# Patient Record
Sex: Female | Born: 1960 | Race: White | Hispanic: No | Marital: Married | Smoking: Never smoker
Health system: Southern US, Community
[De-identification: ages and names within clinical notes are randomized; demographics above are authoritative.]

## PROBLEM LIST (undated history)

## (undated) HISTORY — PX: CHOLECYSTECTOMY: SHX55

---

## 2018-02-05 ENCOUNTER — Encounter (HOSPITAL_COMMUNITY): Payer: Self-pay | Admitting: Emergency Medicine

## 2018-02-05 ENCOUNTER — Emergency Department (HOSPITAL_COMMUNITY)
Admission: EM | Admit: 2018-02-05 | Discharge: 2018-02-05 | Disposition: A | Discharge status: Home / Self Care | Payer: BLUE CROSS/BLUE SHIELD | Attending: Emergency Medicine | Admitting: Emergency Medicine

## 2018-02-05 DIAGNOSIS — M549 Dorsalgia, unspecified: Secondary | ICD-10-CM | POA: Diagnosis present

## 2018-02-05 DIAGNOSIS — M546 Pain in thoracic spine: Secondary | ICD-10-CM | POA: Diagnosis not present

## 2018-02-05 MED ORDER — TRAMADOL HCL 50 MG PO TABS: 50.0000 mg | ORAL_TABLET | Freq: Four times a day (QID) | ORAL | 0 refills | Status: AC | PRN

## 2018-02-05 MED ORDER — LIDOCAINE 5 % EX PTCH: 1.0000 | MEDICATED_PATCH | CUTANEOUS | 0 refills | Status: AC

## 2018-02-05 MED ORDER — HYDROMORPHONE HCL 1 MG/ML IJ SOLN
0.5000 mg | Freq: Once | INTRAMUSCULAR | Status: AC
  Administered 2018-02-05: 0.5 mg via INTRAMUSCULAR
  Filled 2018-02-05: qty 1

## 2018-02-05 NOTE — ED Triage Notes (Signed)
Pt reports left upper back pain since last Saturday. Reports is worse with movement, seen by 3 other doctors without any relief of pain so came to ED for help.

## 2018-02-05 NOTE — ED Notes (Signed)
Pt decline d/c vitals. I offered to recheck them and she states, "let's skip it."

## 2018-02-05 NOTE — Discharge Instructions (Signed)
Continue to take your steroid medication as prescribed.  Do not take ibuprofen, Advil, Aleve, or Motrin with this medication.  You may take [phone] mg of Tylenol every 6 hours as needed for pain additionally.  You may take tramadol as needed for severe pain but do not drive, drink alcohol, or operate heavy machinery while taking this medication as it may make you drowsy.  Apply an ice pack or heating pad to the area for comfort.  Take hot showers or hot baths and do some gentle stretching exercises to avoid muscle stiffness.  Apply the lidocaine patch to the area of pain along the back.  Follow-up with an orthopedist for reevaluation of your symptoms.  I encourage you to continue going to physical therapy 2-3 times weekly at least.  Return to the emergency department if any concerning signs or symptoms develop such as weakness, high fevers

## 2018-02-05 NOTE — ED Provider Notes (Signed)
[facility]-EMERGENCY DEPT Provider Note   CSN: [id number] Arrival date & time: 02/05/18  1512     History   Chief Complaint Chief Complaint  Patient presents with  . Back Pain    HPI Bridget Molina is a 57 y.o. female with no significant past medical history presents today for evaluation of acute onset, constant left upper thoracic back pain for 6 days.  She states that she awoke on Saturday morning with sharp pain just lateral to the spine on the left side.  She states that pain improves laying flat on her back but worsens with any movement or ambulation.  Does not necessarily worsen with range of motion of the left shoulder.  She does note pain radiating down the left upper extremity and intermittent numbness and tingling.  She denies fevers, chills, low back pain, IV drug use, or history of cancer.  She was seen and evaluated at urgent care and was given 20 hydrocodone/acetaminophen 5-325 mg tablets without relief of her symptoms.  She was also given a steroid injection into her hip without relief of her symptoms.  She denies any hip pain so she is unsure why she received this injection into the hip.  She was seen and evaluated by an orthopedic physician Dr. Farris Has 2 days ago and underwent x-rays.  She was told that she had arthritis in her back which could be contributing to her symptoms.  She states he discharged her with a steroid taper and methocarbamol.  She states neither of these medications have been helpful.  She has been taking Tylenol as well without relief of her symptoms.  She is scheduled for an MRI on Thursday but states "I cannot wait that long".  She denies any recent trauma or falls.  The history is provided by the patient.    History reviewed. No pertinent past medical history.  There are no active problems to display for this patient.   Past Surgical History:  Procedure Laterality Date  . CESAREAN SECTION    . CHOLECYSTECTOMY       OB History    None      Home Medications    Prior to Admission medications   Medication Sig Start Date End Date Taking? Authorizing Provider  lidocaine (LIDODERM) 5 % Place 1 patch onto the skin daily. Remove & Discard patch within 12 hours 02/05/18   Michela Pitcher A, PA-C  traMADol (ULTRAM) 50 MG tablet Take 1 tablet (50 mg total) by mouth every 6 (six) hours as needed for severe pain. 02/05/18   Jeanie Sewer, PA-C    Family History No family history on file.  Social History Social History   Tobacco Use  . Smoking status: Never Smoker  . Smokeless tobacco: Never Used  Substance Use Topics  . Alcohol use: Not on file  . Drug use: Not on file     Allergies   Patient has no allergy information on record.   Review of Systems Review of Systems   Physical Exam Updated Vital Signs BP 125/74 (BP Location: Left Arm)   Pulse 83   Temp 98.2 F (36.8 C) (Oral)   Resp 19   Ht 5' (1.524 m)   Wt 72.1 kg (159 lb)   SpO2 100%   BMI 31.05 kg/m   Physical Exam  Constitutional: She is oriented to person, place, and time. She appears well-developed and well-nourished. No distress.  Resting in chair, moving around frequently and appears somewhat uncomfortable.  Somewhat  improved when laying flat  HENT:  Head: Normocephalic and atraumatic.  Eyes: Conjunctivae are normal. Right eye exhibits no discharge. Left eye exhibits no discharge.  Neck: Normal range of motion and full passive range of motion without pain. Neck supple. No JVD present. No tracheal deviation present.  No midline spine TTP, no paraspinal muscle tenderness, no deformity, crepitus, or step-off noted.   Cardiovascular: Normal rate, regular rhythm, normal heart sounds and intact distal pulses.  2+ radial pulses bilaterally  Pulmonary/Chest: Effort normal and breath sounds normal.  Abdominal: She exhibits no distension.  Musculoskeletal: Normal range of motion. She exhibits tenderness. She exhibits no edema.  No midline spine  TTP, no deformity, crepitus, or step-off noted.  She has left-sided parathoracic muscle tenderness at around the level of T4/T5 and muscle spasm in that region.  5/5 strength of BUE and BLE major muscle groups.  Pain is elicited in the back with internal rotation of the left shoulder.  She has mild tenderness to palpation of the left acromioclavicular joint.  No ecchymosis, swelling, or crepitus.  Positive supraspinatus test, negative Hawkins or Neer's impingement sign.   Neurological: She is alert and oriented to person, place, and time. No cranial nerve deficit or sensory deficit. She exhibits normal muscle tone.  Fluent speech with no evidence of dysarthria or aphasia, no facial droop, sensation intact to soft touch of bilateral upper extremities.  Skin: Skin is warm and dry. No erythema.  Psychiatric: She has a normal mood and affect. Her behavior is normal.  Nursing note and vitals reviewed.    ED Treatments / Results  Labs (all labs ordered are listed, but only abnormal results are displayed) Labs Reviewed - No data to display  EKG None  Radiology No results found.  Procedures Procedures (including critical care time)  Medications Ordered in ED Medications  HYDROmorphone (DILAUDID) injection 0.5 mg (0.5 mg Intramuscular Given 02/05/18 1741)     Initial Impression / Assessment and Plan / ED Course  I have reviewed the triage vital signs and the nursing notes.  Pertinent labs & imaging results that were available during my care of the patient were reviewed by me and considered in my medical decision making (see chart for details).     Patient presents with acute left-sided thoracic back pain with radiation into the left upper extremity.  She is afebrile, vital signs are stable.  She is nontoxic in appearance.  She is neurovascularly intact and no focal neurologic deficits on examination.  Pain is relieved when laying flat on her back but worsens with standing and movement.   She has been seen by an urgent care physician and an orthopedist and has been offered narcotic pain medicine, muscle relaxant, and p.o. steroids without relief of her symptoms.  She is in the process of obtaining an MRI later next week for further evaluation.  I doubt acute cardiopulmonary abnormality.  Symptoms do appear to be musculoskeletal in etiology, possible thoracic radiculopathy.  No instability to the spine noted on examination.  She has had imaging with other physicians which suggested possible osteoarthritis.  I doubt CVA, MI, EBV.  I encouraged her to continue taking her steroid pack as she has not completed it.  I offered her tramadol and lidocaine patches which she accepted.  I encouraged her to follow-up with physical therapy at least 2-3 times weekly and follow-up with her orthopedist for further evaluation and possible steroid injections.  I also discussed the utility of ice, heat, and gentle  stretching.She tells me she was not impressed by the bedside manner of the orthopedic physician who treated her and I encouraged her to find another orthopedic physician to manage her symptoms in the long run.  I discussed strict ED return precautions.  Patient and patient's husband verbalized understanding of and agreement with plan and patient is stable for discharge home at this time.  Final Clinical Impressions(s) / ED Diagnoses   Final diagnoses:  Acute left-sided thoracic back pain    ED Discharge Orders        Ordered    lidocaine (LIDODERM) 5 %  Every 24 hours     02/05/18 1727    traMADol (ULTRAM) 50 MG tablet  Every 6 hours PRN     02/05/18 1727       Jeanie SewerFawze, Chanon Loney A, PA-C 02/05/18 2153    Arby BarrettePfeiffer, Marcy, MD 02/08/18 [phone]

## 2018-02-15 ENCOUNTER — Other Ambulatory Visit: Payer: Self-pay | Admitting: Sports Medicine

## 2018-02-15 DIAGNOSIS — M5412 Radiculopathy, cervical region: Secondary | ICD-10-CM

## 2018-02-19 ENCOUNTER — Ambulatory Visit
Admission: RE | Admit: 2018-02-19 | Discharge: 2018-02-19 | Disposition: A | Discharge status: Home / Self Care | Payer: BLUE CROSS/BLUE SHIELD | Source: Ambulatory Visit | Attending: Sports Medicine | Admitting: Sports Medicine

## 2018-02-19 DIAGNOSIS — M5412 Radiculopathy, cervical region: Secondary | ICD-10-CM

## 2018-02-19 MED ORDER — TRIAMCINOLONE ACETONIDE 40 MG/ML IJ SUSP (RADIOLOGY)
60.0000 mg | Freq: Once | INTRAMUSCULAR | Status: AC
  Administered 2018-02-19: 60 mg via EPIDURAL

## 2018-02-19 MED ORDER — IOPAMIDOL (ISOVUE-M 300) INJECTION 61%
1.0000 mL | Freq: Once | INTRAMUSCULAR | Status: AC | PRN
  Administered 2018-02-19: 1 mL via EPIDURAL

## 2018-02-19 NOTE — Discharge Instructions (Signed)

## 2018-02-25 ENCOUNTER — Other Ambulatory Visit: Payer: BLUE CROSS/BLUE SHIELD

## 2018-02-25 ENCOUNTER — Other Ambulatory Visit: Payer: Self-pay | Admitting: Sports Medicine

## 2018-02-25 DIAGNOSIS — M5412 Radiculopathy, cervical region: Secondary | ICD-10-CM

## 2018-03-08 ENCOUNTER — Ambulatory Visit
Admission: RE | Admit: 2018-03-08 | Discharge: 2018-03-08 | Disposition: A | Discharge status: Home / Self Care | Payer: BLUE CROSS/BLUE SHIELD | Source: Ambulatory Visit | Attending: Sports Medicine | Admitting: Sports Medicine

## 2018-03-08 DIAGNOSIS — M5412 Radiculopathy, cervical region: Secondary | ICD-10-CM

## 2018-03-08 MED ORDER — TRIAMCINOLONE ACETONIDE 40 MG/ML IJ SUSP (RADIOLOGY)
60.0000 mg | Freq: Once | INTRAMUSCULAR | Status: AC
  Administered 2018-03-08: 60 mg via EPIDURAL

## 2018-03-08 MED ORDER — IOPAMIDOL (ISOVUE-M 300) INJECTION 61%
1.0000 mL | Freq: Once | INTRAMUSCULAR | Status: AC | PRN
  Administered 2018-03-08: 1 mL via EPIDURAL

## 2018-10-17 IMAGING — XA DG INJECT/[PERSON_NAME] INC NEEDLE/CATH/PLC EPI/CERV/THOR W/IMG
2 series · 2 of 2 positions shown · non-contrast
Comparison: none

CLINICAL DATA: Cervical radiculopathy. Left scapular pain and upper
extremity radiculopathy. The patient reports approximately 50%
improvement since the prior injection. The relief was better at
first, but is gradually getting worse.

[Series 1: ortho standard · 1 of 1 slices shown (1 of 2)]
[im 1/1]
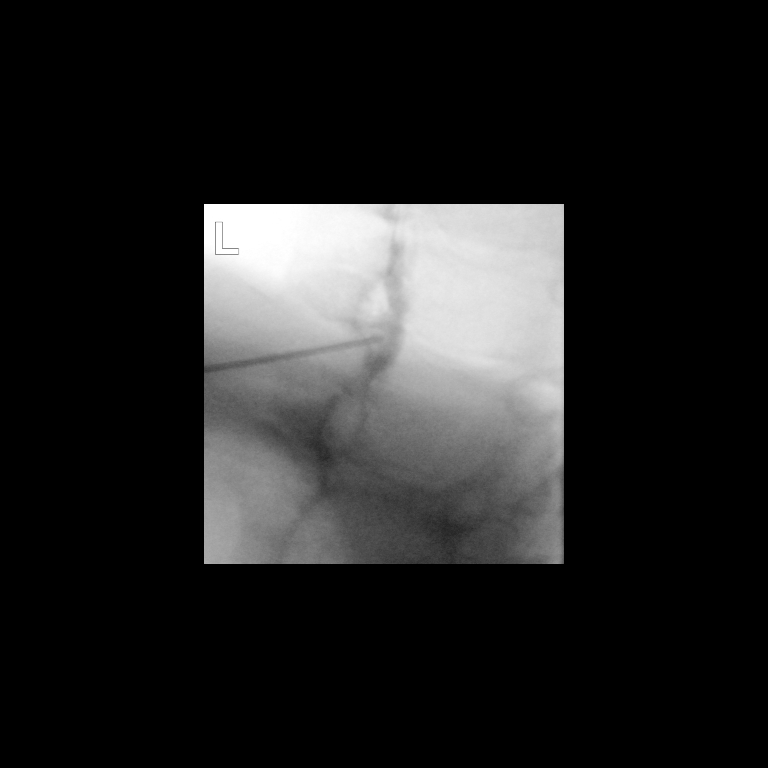

[Series 3: ortho standard · 1 of 1 slices shown (2 of 2)]
[im 1/1]
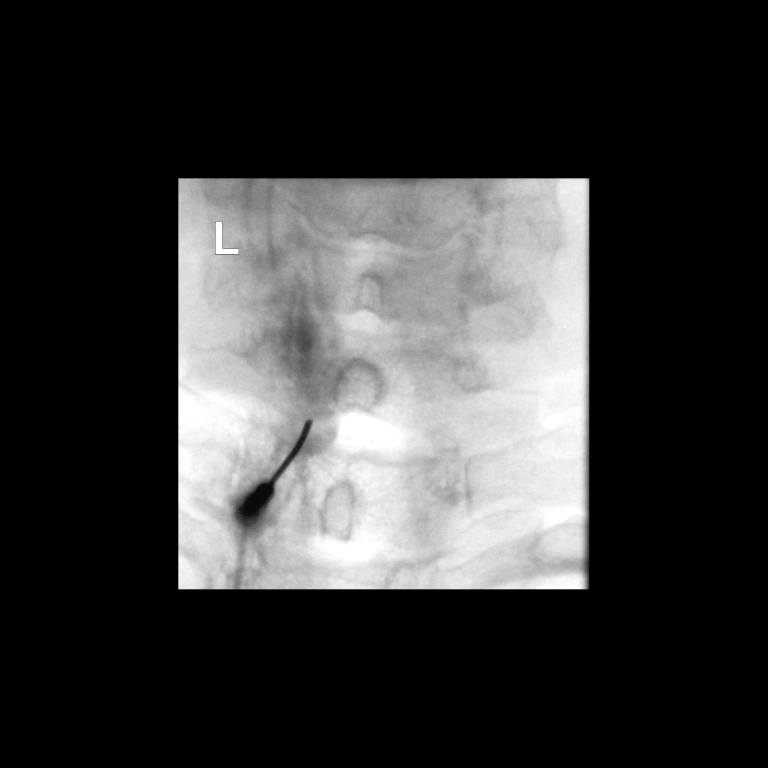

[2 of 2 positions shown; findings below may reference images not displayed]

FLUOROSCOPY TIME:  Radiation Exposure Index (as provided by the
fluoroscopic device): 7.06 uGy*m2

Fluoroscopy Time:  13 seconds

Number of Acquired Images:  0

PROCEDURE:
CERVICAL EPIDURAL INJECTION

An interlaminar approach was performed on the left at C7-T1. A 20
gauge epidural needle was advanced using loss-of-resistance
technique.

DIAGNOSTIC EPIDURAL INJECTION

Injection of Isovue-M 300 shows a good epidural pattern with spread
above and below the level of needle placement, primarily on the
left. No vascular opacification is seen. THERAPEUTIC

EPIDURAL INJECTION

1.5 ml of Kenalog 40 mixed with 1 ml of 1% Lidocaine and 2 ml of
normal saline were then instilled. The procedure was well-tolerated,
and the patient was discharged thirty minutes following the
injection in good condition.
IMPRESSION: Technically successful second epidural injection on the left at
C7-T1.
